# Patient Record
Sex: Female | Born: 2000 | Race: Black or African American | Hispanic: No | Marital: Single | Smoking: Never smoker
Health system: Southern US, Community
[De-identification: ages and names within clinical notes are randomized; demographics above are authoritative.]

---
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
## 2021-06-09 ENCOUNTER — Other Ambulatory Visit: Payer: Self-pay

## 2021-06-09 ENCOUNTER — Emergency Department
Admission: EM | Admit: 2021-06-09 | Discharge: 2021-06-09 | Disposition: A | Discharge status: Home / Self Care | Payer: Medicaid Other | Attending: Emergency Medicine | Admitting: Emergency Medicine

## 2021-06-09 ENCOUNTER — Emergency Department: Payer: Medicaid Other

## 2021-06-09 DIAGNOSIS — T7840XA Allergy, unspecified, initial encounter: Secondary | ICD-10-CM | POA: Insufficient documentation

## 2021-06-09 DIAGNOSIS — R1310 Dysphagia, unspecified: Secondary | ICD-10-CM

## 2021-06-09 MED ORDER — METHYLPREDNISOLONE SODIUM SUCC 125 MG IJ SOLR
125.0000 mg | Freq: Once | INTRAMUSCULAR | Status: AC
  Administered 2021-06-09: 125 mg via INTRAMUSCULAR
  Filled 2021-06-09: qty 2

## 2021-06-09 NOTE — Discharge Instructions (Addendum)
No acute findings on soft tissue neck x-ray.  Monitor complaint and follow-up with ENT clinic if no improvement in 3 to 5 days.  Return right ED if condition worsens.

## 2021-06-09 NOTE — ED Notes (Signed)
See triage note  Presents with p[ossible allergic rxn  States she had hives then hives went away  Bit states she noticed that she had some swelling around her eyes

## 2021-06-09 NOTE — ED Triage Notes (Signed)
Pt to ED POV for possible allergic rxn x2 days, states gets rash when uses the wrong detergent. C/o throat pain No hives noted. No swelling to face, mouth or tongue. RR even and unlabored.  Ambulatory

## 2021-06-09 NOTE — ED Provider Notes (Signed)
[facility] Emergency Department Provider Note   ____________________________________________   Event Date/Time   First MD Initiated Contact with Patient 06/09/21 1220     (approximate)  I have reviewed the triage vital signs and the nursing notes.   HISTORY  Chief Complaint Allergic Reaction    HPI Kristy Larson is a 20 y.o. female patient stated positive allergic reaction for 2 days.  Patient state last night she had hives and awakened this morning with dysphagia.  Denies new foods or drinks.  Patient states she normally has a body reaction to the laundry products but has not purchased any new detergent in the past month.  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  Patient denies angioedema.  Patient rashes dissipated.     History reviewed. No pertinent past medical history.  There are no problems to display for this patient.   History reviewed. No pertinent surgical history.  Prior to Admission medications   Not on File    Allergies Patient has no allergy information on record.  No family history on file.  Social History    Review of Systems  Constitutional: No fever/chills Eyes: No visual changes. ENT: No sore throat.  Mild to moderate distress with swallowing. Cardiovascular: Denies chest pain. Respiratory: Denies shortness of breath. Gastrointestinal: No abdominal pain.  No nausea, no vomiting.  No diarrhea.  No constipation. Genitourinary: Negative for dysuria. Musculoskeletal: Negative for back pain. Skin: Negative for rash. Neurological: Negative for headaches, focal weakness or numbness.   ____________________________________________   PHYSICAL EXAM:  VITAL SIGNS: ED Triage Vitals  Enc Vitals Group     BP 06/09/21 1133 (!) 126/91     Pulse Rate 06/09/21 1133 77     Resp 06/09/21 1133 20     Temp 06/09/21 1133 98.6 F (37 C)     Temp Source 06/09/21 1133 Oral     SpO2  06/09/21 1133 98 %     Weight 06/09/21 1134 (!) 315 lb (142.9 kg)     Height 06/09/21 1134 5\' 6"  (1.676 m)     Head Circumference --      Peak Flow --      Pain Score 06/09/21 1133 4     Pain Loc --      Pain Edu? --      Excl. in GC? --     Constitutional: Alert and oriented. Well appearing and in no acute distress.  BMI is 50.84. Eyes: Conjunctivae are normal. PERRL. EOMI. Head: Atraumatic. Nose: No congestion/rhinnorhea. Mouth/Throat: Mucous membranes are moist.  Oropharynx non-erythematous. Neck: No stridor.   Cardiovascular: Normal rate, regular rhythm. Grossly normal heart sounds.  Good peripheral circulation. Respiratory: Normal respiratory effort.  No retractions. Lungs CTAB. Gastrointestinal: Soft and nontender.  Distention secondary to body habitus. No abdominal bruits. No CVA tenderness. Genitourinary: Deferred Musculoskeletal: No lower extremity tenderness nor edema.  No joint effusions. Neurologic:  Normal speech and language. No gross focal neurologic deficits are appreciated. No gait instability. Skin:  Skin is warm, dry and intact. No rash noted. Psychiatric: Mood and affect are normal. Speech and behavior are normal.  ____________________________________________   LABS (all labs ordered are listed, but only abnormal results are displayed)  Labs Reviewed - No data to display ____________________________________________  EKG   ____________________________________________  RADIOLOGY I, 06/11/21, personally viewed and evaluated these images (plain radiographs) as part of my medical decision making, as well as reviewing the written report by  the radiologist.  ED MD interpretation:    Official radiology report(s): DG Neck Soft Tissue  Result Date: 06/09/2021 CLINICAL DATA:  Dysphagia EXAM: NECK SOFT TISSUES - 1+ VIEW COMPARISON:  None. FINDINGS: There is no evidence of retropharyngeal soft tissue swelling or epiglottic enlargement. The cervical airway  is unremarkable and no radio-opaque foreign body identified. Mild adenoid hypertrophy. Small cervical ribs bilaterally. IMPRESSION: Normal airway. Electronically Signed   By: Marlan Palau M.D.   On: 06/09/2021 12:50    ____________________________________________   PROCEDURES  Procedure(s) performed (including Critical Care):  Procedures   ____________________________________________   INITIAL IMPRESSION / ASSESSMENT AND PLAN / ED COURSE  As part of my medical decision making, I reviewed the following data within the electronic MEDICAL RECORD NUMBER         Patient presents with difficulty swallowing after experiencing a rash last night.  Patient rash has resolved.  Discussed no acute findings on soft tissue neck x-ray.  Patient complaining physical exam consistent with mild dysphagia of unspecified etiology.  Patient advised to continue to monitor condition if no improvement follow-up with ENT clinic in 3 to 5 days.  Return to ED if condition worsens.      ____________________________________________   FINAL CLINICAL IMPRESSION(S) / ED DIAGNOSES  Final diagnoses:  Allergic reaction, initial encounter  Dysphagia, unspecified type     ED Discharge Orders     None        Note:  This document was prepared using Dragon voice recognition software and may include unintentional dictation errors.    Joni Reining, PA-C 06/09/21 1307    Chesley Noon, MD 06/09/21 [phone]

## 2021-07-22 ENCOUNTER — Encounter: Payer: Self-pay | Admitting: Emergency Medicine

## 2021-07-22 ENCOUNTER — Other Ambulatory Visit: Payer: Self-pay

## 2021-07-22 ENCOUNTER — Emergency Department
Admission: EM | Admit: 2021-07-22 | Discharge: 2021-07-22 | Disposition: A | Discharge status: Home / Self Care | Payer: Medicaid Other | Attending: Emergency Medicine | Admitting: Emergency Medicine

## 2021-07-22 ENCOUNTER — Emergency Department: Payer: Medicaid Other

## 2021-07-22 DIAGNOSIS — S63502A Unspecified sprain of left wrist, initial encounter: Secondary | ICD-10-CM | POA: Diagnosis not present

## 2021-07-22 DIAGNOSIS — W1789XA Other fall from one level to another, initial encounter: Secondary | ICD-10-CM | POA: Insufficient documentation

## 2021-07-22 DIAGNOSIS — S66912A Strain of unspecified muscle, fascia and tendon at wrist and hand level, left hand, initial encounter: Secondary | ICD-10-CM | POA: Diagnosis not present

## 2021-07-22 DIAGNOSIS — S6992XA Unspecified injury of left wrist, hand and finger(s), initial encounter: Secondary | ICD-10-CM | POA: Diagnosis present

## 2021-07-22 DIAGNOSIS — M25532 Pain in left wrist: Secondary | ICD-10-CM | POA: Insufficient documentation

## 2021-07-22 MED ORDER — MELOXICAM 15 MG PO TABS: 15.0000 mg | ORAL_TABLET | Freq: Every day | ORAL | 2 refills | Status: AC

## 2021-07-22 MED ORDER — IBUPROFEN 600 MG PO TABS
600.0000 mg | ORAL_TABLET | Freq: Once | ORAL | Status: AC
  Administered 2021-07-22: 600 mg via ORAL
  Filled 2021-07-22: qty 1

## 2021-07-22 NOTE — ED Triage Notes (Signed)
Says she fell about a week ago and injured left wrist.  It has been hurting, but when she woke this am it was much worse.

## 2021-07-22 NOTE — ED Provider Notes (Signed)
[facility] Emergency Department Provider Note  ____________________________________________   Event Date/Time   First MD Initiated Contact with Patient 07/22/21 [phone]     (approximate)  I have reviewed the triage vital signs and the nursing notes.   HISTORY  Chief Complaint Wrist Pain    HPI Kristy Larson is a 20 y.o. female presents emergency department complaining of left wrist pain.  States she fell 1 to 2 weeks ago and landed on her wrist.  States it was doing okay but overnight it became much worse.  States it hurts to bend the wrist or make a fist.  No numbness or tingling.  No elbow pain.  No fever or chills.  Patient has not taken anything for her pain.  History reviewed. No pertinent past medical history.  There are no problems to display for this patient.   History reviewed. No pertinent surgical history.  Prior to Admission medications   Medication Sig Start Date End Date Taking? Authorizing Provider  meloxicam (MOBIC) 15 MG tablet Take 1 tablet (15 mg total) by mouth daily. 07/22/21 07/22/22 Yes Jenea Dake, Roselyn Bering, PA-C    Allergies Pineapple extract  No family history on file.  Social History Social History   Tobacco Use   Smoking status: Never   Smokeless tobacco: Never  Substance Use Topics   Alcohol use: Not Currently    Review of Systems  Constitutional: No fever/chills Eyes: No visual changes. ENT: No sore throat. Respiratory: Denies cough Genitourinary: Negative for dysuria. Musculoskeletal: Negative for back pain.  Positive left wrist pain Skin: Negative for rash. Psychiatric: no mood changes,     ____________________________________________   PHYSICAL EXAM:  VITAL SIGNS: ED Triage Vitals  Enc Vitals Group     BP 07/22/21 0734 (!) 146/99     Pulse Rate 07/22/21 0734 89     Resp 07/22/21 0734 16     Temp 07/22/21 0734 98.5 F (36.9 C)     Temp Source 07/22/21 0734 Oral     SpO2 07/22/21 0734 100 %      Weight 07/22/21 0735 (!) 315 lb (142.9 kg)     Height 07/22/21 0735 5\' 6"  (1.676 m)     Head Circumference --      Peak Flow --      Pain Score 07/22/21 0734 10     Pain Loc --      Pain Edu? --      Excl. in GC? --     Constitutional: Alert and oriented. Well appearing and in no acute distress. Eyes: Conjunctivae are normal.  Head: Atraumatic. Nose: No congestion/rhinnorhea. Mouth/Throat: Mucous membranes are moist.   Neck:  supple no lymphadenopathy noted Cardiovascular: Normal rate, regular rhythm.  Respiratory: Normal respiratory effort.  No retractions GU: deferred Musculoskeletal: FROM all extremities, warm and well perfused, left hand is tender, left wrist is tender, neurovascular is intact, left elbow is nontender Neurologic:  Normal speech and language.  Skin:  Skin is warm, dry and intact. No rash noted. Psychiatric: Mood and affect are normal. Speech and behavior are normal.  ____________________________________________   LABS (all labs ordered are listed, but only abnormal results are displayed)  Labs Reviewed - No data to display ____________________________________________   ____________________________________________  RADIOLOGY  X-ray of the left wrist  ____________________________________________   PROCEDURES  Procedure(s) performed: Left wrist brace applied by nursing staff   Procedures    ____________________________________________   INITIAL IMPRESSION / ASSESSMENT AND PLAN / ED COURSE  Pertinent labs & imaging results that were available during my care of the patient were reviewed by me and considered in my medical decision making (see chart for details).   The patient is a 20 year old female presents emergency department with left wrist pain.  See HPI.  Physical exam shows patient to appear well.  X-ray of the left wrist reviewed by me confirmed by radiology to be negative.  Patient was placed on a wrist brace for comfort.  She was  given a prescription for meloxicam.  She is to follow-up with her regular doctor if not improving in 2 to 3 days.  Return emergency department worsening.  [facility]-up with [facility] if not better in 1 week.  She is to apply ice.  She was discharged stable condition.     Kristy Larson was evaluated in Emergency Department on 07/22/2021 for the symptoms described in the history of present illness. She was evaluated in the context of the global COVID-19 pandemic, which necessitated consideration that the patient might be at risk for infection with the SARS-CoV-2 virus that causes COVID-19. Institutional protocols and algorithms that pertain to the evaluation of patients at risk for COVID-19 are in a state of rapid change based on information released by regulatory bodies including the CDC and federal and state organizations. These policies and algorithms were followed during the patient's care in the ED.    As part of my medical decision making, I reviewed the following data within the electronic MEDICAL RECORD NUMBER Nursing notes reviewed and incorporated, Old chart reviewed, Radiograph reviewed , Notes from prior ED visits, and [place] Controlled Substance Database  ____________________________________________   FINAL CLINICAL IMPRESSION(S) / ED DIAGNOSES  Final diagnoses:  Sprain and strain of left wrist      NEW MEDICATIONS STARTED DURING THIS VISIT:  New Prescriptions   MELOXICAM (MOBIC) 15 MG TABLET    Take 1 tablet (15 mg total) by mouth daily.     Note:  This document was prepared using Dragon voice recognition software and may include unintentional dictation errors.    Faythe Ghee, PA-C 07/22/21 1000    Arnaldo Natal, MD 07/22/21 [phone]

## 2021-11-25 ENCOUNTER — Other Ambulatory Visit: Payer: Self-pay

## 2021-11-25 DIAGNOSIS — T7840XA Allergy, unspecified, initial encounter: Secondary | ICD-10-CM | POA: Insufficient documentation

## 2021-11-25 DIAGNOSIS — L299 Pruritus, unspecified: Secondary | ICD-10-CM | POA: Diagnosis not present

## 2021-11-25 NOTE — ED Triage Notes (Signed)
Pt states she has been taking benadryl for the last two days. Pt states she also has had a irritated throat for the past two days, Hives all over her body started today. Eyelid was swollen earlier. Denies any difficulty breathing. Took benadryl yesterday and did not take medication for it today.  Pt states this happens every time she comes home from school. Is not sure if it is her mothers house or the detergent she uses.

## 2021-11-26 ENCOUNTER — Emergency Department
Admission: EM | Admit: 2021-11-26 | Discharge: 2021-11-26 | Disposition: A | Discharge status: Home / Self Care | Payer: Medicaid Other | Attending: Emergency Medicine | Admitting: Emergency Medicine

## 2021-11-26 DIAGNOSIS — T7840XA Allergy, unspecified, initial encounter: Secondary | ICD-10-CM

## 2021-11-26 MED ORDER — PREDNISONE 20 MG PO TABS: 40.0000 mg | ORAL_TABLET | Freq: Every day | ORAL | 0 refills | Status: AC

## 2021-11-26 MED ORDER — EPINEPHRINE 0.3 MG/0.3ML IJ SOAJ: 0.3000 mg | INTRAMUSCULAR | 0 refills | Status: DC | PRN

## 2021-11-26 NOTE — Discharge Instructions (Addendum)
Please seek medical attention for any high fevers, chest pain, shortness of breath, change in behavior, persistent vomiting, bloody stool or any other new or concerning symptoms.  

## 2021-11-26 NOTE — ED Provider Notes (Signed)
[facility] Emergency Department Provider Note   ____________________________________________   I have reviewed the triage vital signs and the nursing notes.   HISTORY  Chief Complaint Allergies   History limited by: Not Limited   HPI Kristy Larson is a 20 y.o. female who presents to the emergency department today because of concerns for allergic reaction.  The patient states for the past 2 to 3 days she has noticed hives.  This has been itchy.  She comes in tonight because she started feeling some itchiness in her throat.  She has had similar symptoms in the past although has never had the throat involvement.  Patient has not seen an allergy specialist.  She has been trying Benadryl over the past couple of days without any significant relief.  She denies any vomiting.  No lightheadedness or syncope.  Records reviewed. Per medical record review patient has a history of ER visit roughly 6 months ago for allergic reaction.   There are no problems to display for this patient.   No past surgical history on file.  Prior to Admission medications   Medication Sig Start Date End Date Taking? Authorizing Provider  EPINEPHrine (EPIPEN 2-PAK) 0.3 mg/0.3 mL IJ SOAJ injection Inject 0.3 mg into the muscle as needed for anaphylaxis. 11/26/21  Yes Phineas Semen, MD  predniSONE (DELTASONE) 20 MG tablet Take 2 tablets (40 mg total) by mouth daily with breakfast for 4 days. 11/26/21 11/30/21 Yes Phineas Semen, MD  meloxicam (MOBIC) 15 MG tablet Take 1 tablet (15 mg total) by mouth daily. 07/22/21 07/22/22  Sherrie Mustache Roselyn Bering, PA-C    Allergies Pineapple extract  No family history on file.  Social History Social History   Tobacco Use   Smoking status: Never   Smokeless tobacco: Never  Substance Use Topics   Alcohol use: Not Currently    Review of Systems Constitutional: No fever/chills Eyes: No visual changes. ENT: Positive for itchy throat.  Cardiovascular:  Denies chest pain. Respiratory: Denies shortness of breath. Gastrointestinal: No abdominal pain.  No nausea, no vomiting.  No diarrhea.   Genitourinary: Negative for dysuria. Musculoskeletal: Negative for back pain. Skin: Positive for rash and pruritis.  Neurological: Negative for headaches, focal weakness or numbness.  ____________________________________________   PHYSICAL EXAM:  VITAL SIGNS: ED Triage Vitals  Enc Vitals Group     BP 11/25/21 2228 117/77     Pulse Rate 11/25/21 2228 (!) 105     Resp 11/25/21 2228 18     Temp 11/25/21 2228 98.6 F (37 C)     Temp Source 11/25/21 2228 Oral     SpO2 11/25/21 2228 97 %     Weight 11/25/21 2231 (!) 315 lb 0.6 oz (142.9 kg)     Height --      Head Circumference --      Peak Flow --      Pain Score 11/25/21 2231 0   Constitutional: Alert and oriented.  Eyes: Conjunctivae are normal.  ENT      Head: Normocephalic and atraumatic.      Nose: No congestion/rhinnorhea.      Mouth/Throat: Mucous membranes are moist.      Neck: No stridor. Hematological/Lymphatic/Immunilogical: No cervical lymphadenopathy. Cardiovascular: Normal rate, regular rhythm.  No murmurs, rubs, or gallops.  Respiratory: Normal respiratory effort without tachypnea nor retractions. Breath sounds are clear and equal bilaterally. No wheezes/rales/rhonchi. Gastrointestinal: Soft and non tender. No rebound. No guarding.  Genitourinary: Deferred Musculoskeletal: Normal range of motion in all  extremities. No lower extremity edema. Neurologic:  Normal speech and language. No gross focal neurologic deficits are appreciated.  Skin:  Hives Psychiatric: Mood and affect are normal. Speech and behavior are normal. Patient exhibits appropriate insight and judgment.  ____________________________________________    LABS (pertinent positives/negatives)  None  ____________________________________________   EKG  None  ____________________________________________     RADIOLOGY  None  ____________________________________________   PROCEDURES  Procedures  ____________________________________________   INITIAL IMPRESSION / ASSESSMENT AND PLAN / ED COURSE  Pertinent labs & imaging results that were available during my care of the patient were reviewed by me and considered in my medical decision making (see chart for details).   Patient presents to the emergency department today because of concerns for an allergic reaction.  Unclear allergen at this point.  Patient does have hives.  No shortness of breath at this time.  Will plan on starting patient on prednisone.  Encourage patient to continue Benadryl.  Will also give patient prescription for EpiPen in case patient develops throat closing or shortness of breath.  ____________________________________________   FINAL CLINICAL IMPRESSION(S) / ED DIAGNOSES  Final diagnoses:  Allergic reaction, initial encounter     Note: This dictation was prepared with Dragon dictation. Any transcriptional errors that result from this process are unintentional     Phineas Semen, MD 11/26/21 [phone]

## 2023-01-02 IMAGING — CR DG WRIST COMPLETE 3+V*L*
4 series · 4 of 4 positions shown · non-contrast
Comparison: None.

CLINICAL DATA: 19-year-old female status post fall 1-2 weeks ago
with continued pain which is more severe this morning.

EXAM:
LEFT WRIST - COMPLETE 3+ VIEW

[wrist pa]
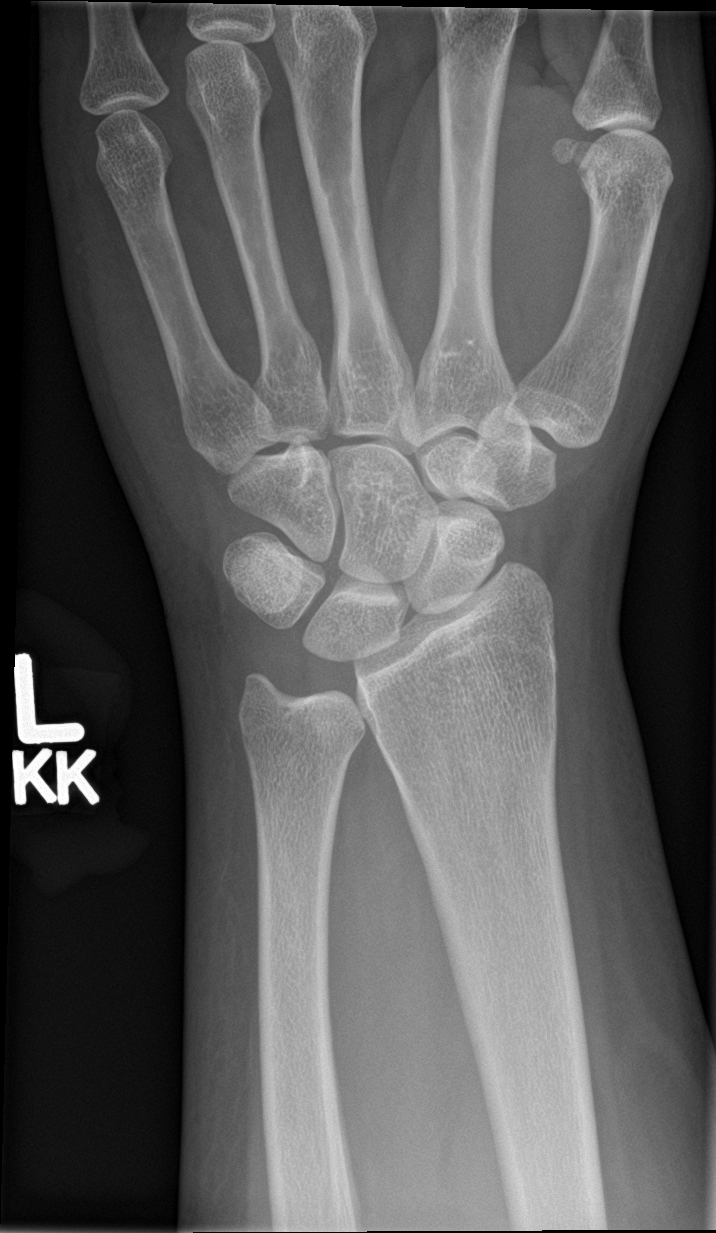

[wrist obl]
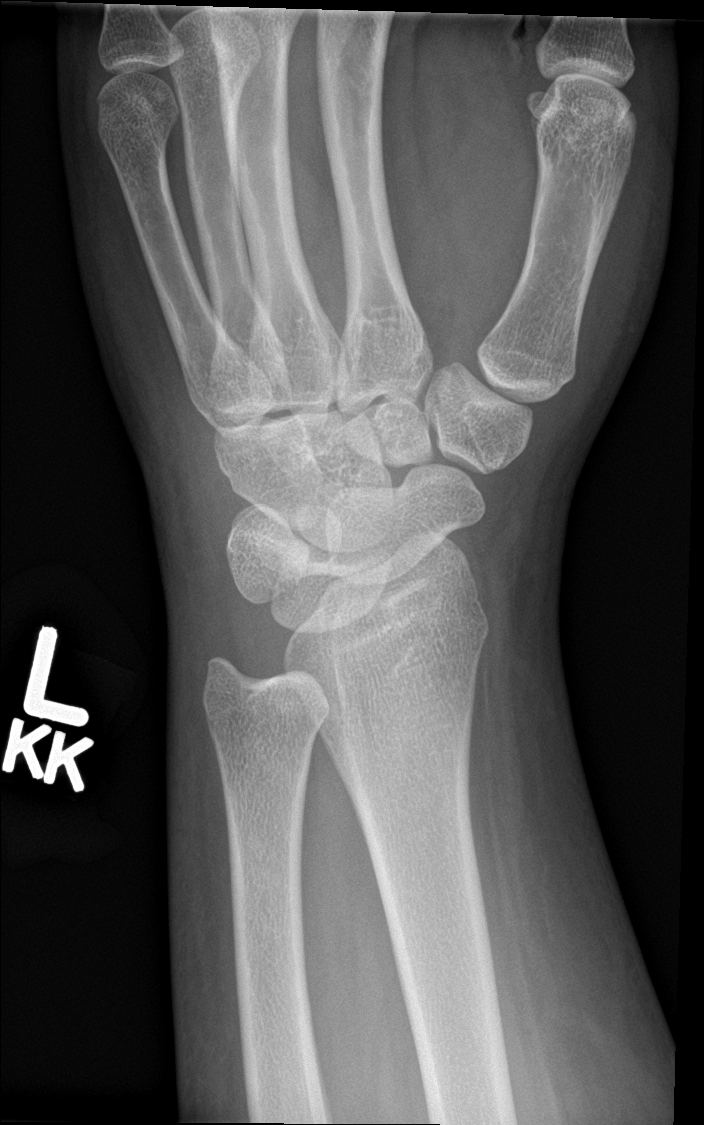

[wrist lat]
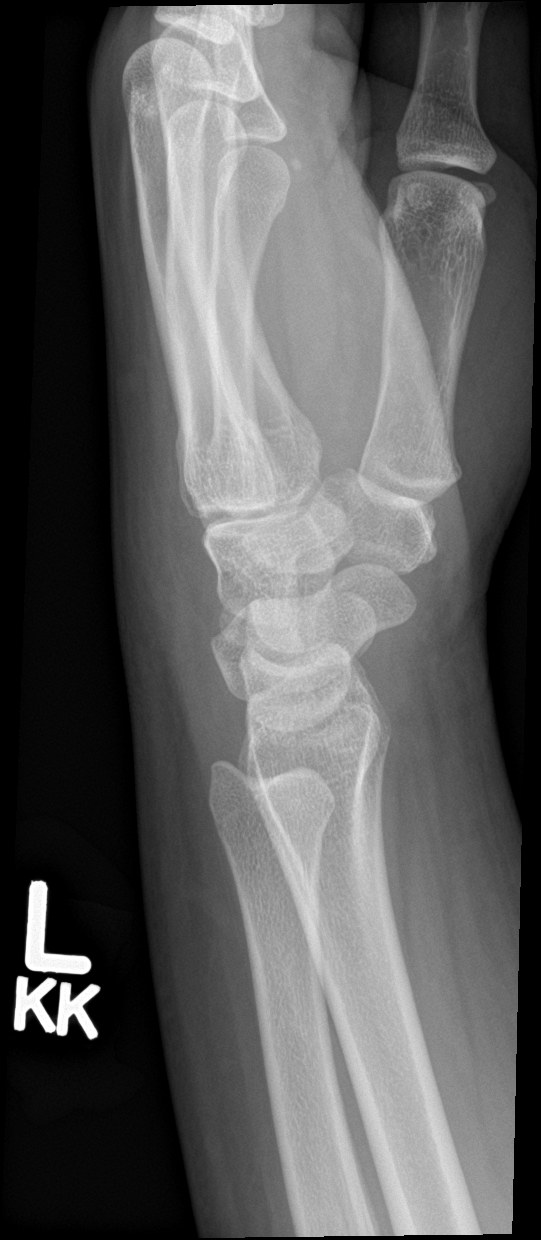

[navicular]
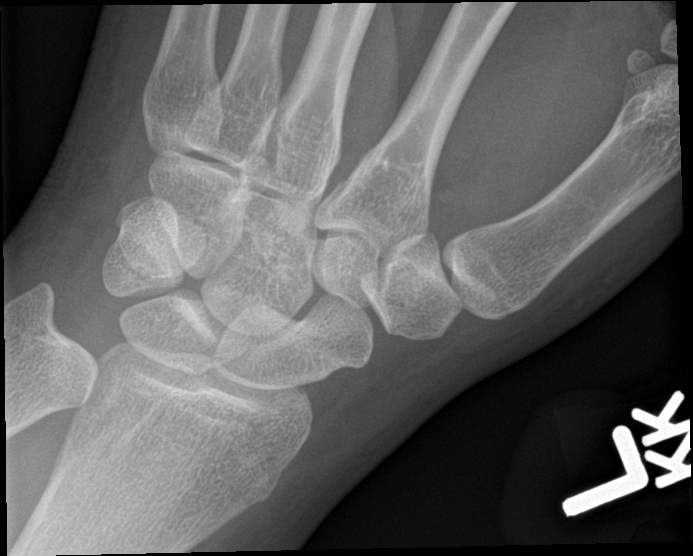

[4 of 4 positions shown; findings below may reference images not displayed]

FINDINGS: Bone mineralization is within normal limits. Distal radius and ulna
appear intact. Carpal bones appear intact with normal joint spaces
and alignment. Visible metacarpals appear intact. No osseous
abnormality identified. mild generalized soft tissue swelling at the
wrist.
IMPRESSION: Generalized soft tissue swelling at the left wrist. No osseous
abnormality identified.

## 2024-01-15 ENCOUNTER — Ambulatory Visit: Payer: Medicaid Other | Admitting: Family Medicine

## 2024-01-17 ENCOUNTER — Telehealth: Payer: Self-pay

## 2024-01-17 NOTE — Telephone Encounter (Signed)
LVM--We are sorry that you missed your appointment with Kristy Larson  on 01/15/2024 . Your health and follow-up medical care are important to [place]. Please call or reschedule appointment on Mychart.  If you have already rescheduled your appointment, please disregard this. Ok for [facility] to reschedule.

## 2024-06-06 ENCOUNTER — Other Ambulatory Visit

## 2024-06-09 ENCOUNTER — Other Ambulatory Visit

## 2024-07-11 ENCOUNTER — Ambulatory Visit: Admitting: Physician Assistant

## 2024-09-30 ENCOUNTER — Ambulatory Visit: Admitting: Family Medicine

## 2024-09-30 ENCOUNTER — Encounter: Payer: Self-pay | Admitting: Family Medicine

## 2024-09-30 VITALS — BP 113/74 | HR 91 | Temp 98.7°F | Resp 20 | Ht 66.0 in | Wt 378.0 lb

## 2024-09-30 DIAGNOSIS — T782XXD Anaphylactic shock, unspecified, subsequent encounter: Secondary | ICD-10-CM | POA: Diagnosis not present

## 2024-09-30 DIAGNOSIS — E039 Hypothyroidism, unspecified: Secondary | ICD-10-CM | POA: Insufficient documentation

## 2024-09-30 DIAGNOSIS — T782XXA Anaphylactic shock, unspecified, initial encounter: Secondary | ICD-10-CM | POA: Insufficient documentation

## 2024-09-30 DIAGNOSIS — Z23 Encounter for immunization: Secondary | ICD-10-CM

## 2024-09-30 DIAGNOSIS — N920 Excessive and frequent menstruation with regular cycle: Secondary | ICD-10-CM

## 2024-09-30 MED ORDER — EPINEPHRINE 0.3 MG/0.3ML IJ SOAJ: 0.3000 mg | INTRAMUSCULAR | 0 refills | Status: AC | PRN

## 2024-09-30 NOTE — Assessment & Plan Note (Signed)
 Checking her CBC and iron indices.

## 2024-09-30 NOTE — Progress Notes (Signed)
 New Patient Office Visit  Subjective    Patient ID: Kristy Larson, female    DOB: 2001/07/18  Age: 23 y.o. 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  Discussed the use of AI scribe software for clinical note transcription with the patient, who gave verbal consent to proceed.  History of Present Illness   Kristy Larson is a 23 year old female who presents for re-establishment of care after her previous doctor's office closed.  She has a history of thyroid issues and was previously prescribed levothyroxine, which she has not taken for over a year due to her previous doctor's office closing. Her last blood test indicated elevated thyroid levels, which were in the double digits when they should have been in the single digits. She has not had any recent follow-up or medication management for her thyroid condition.  She graduated from [employer] with a degree in education and is currently working as a Physicist, medical at [employer] She lives with her mother and primarily handles the cooking at home.  Her menstrual cycles are regular and usually light, except for the second day, which is the heaviest. She does not use contraception. Anemia runs in her family.  She has no known medication allergies but carries an EpiPen  for a pineapple allergy. She has not had any surgeries.       Outpatient Encounter Medications as of 09/30/2024  Medication Sig   [DISCONTINUED] EPINEPHrine  (EPIPEN  2-PAK) 0.3 mg/0.3 mL IJ SOAJ injection Inject 0.3 mg into the muscle as needed for anaphylaxis.   EPINEPHrine  (EPIPEN  2-PAK) 0.3 mg/0.3 mL IJ SOAJ injection Inject 0.3 mg into the muscle as needed for anaphylaxis.   No facility-administered encounter medications on file as of 09/30/2024.    History reviewed. No  pertinent past medical history.  History reviewed. No pertinent surgical history.  History reviewed. No pertinent family history.  Social History   Socioeconomic History   Marital status: Single    Spouse name: Not on file   Number of children: Not on file   Years of education: Not on file   Highest education level: Not on file  Occupational History   Not on file  Tobacco Use   Smoking status: Never    Passive exposure: Current   Smokeless tobacco: Never  Substance and Sexual Activity   Alcohol use: Not Currently   Drug use: Never   Sexual activity: Not on file  Other Topics Concern   Not on file  Social History Narrative   Not on file   Social Drivers of Health   Financial Resource Strain: Not on file  Food Insecurity: Not on file  Transportation Needs: Not on file  Physical Activity: Not on file  Stress: Not on file  Social Connections: Not on file  Intimate Partner Violence: Not on file    ROS      Objective   BP 113/74 (BP Location: Left Arm, Patient Position: Sitting, Cuff Size: Large)   Pulse 91   Temp 98.7 F (37.1 C) (Oral)   Resp 20   Ht 5' 6 (1.676 m)   Wt (!) 378 lb (171.5 kg)   LMP 09/19/2024 (Exact Date)   SpO2 97%   BMI 61.01 kg/m    Physical Exam Vitals and nursing note reviewed.  Constitutional:  Appearance: Normal appearance.  HENT:     Head: Normocephalic and atraumatic.  Eyes:     Conjunctiva/sclera: Conjunctivae normal.  Cardiovascular:     Rate and Rhythm: Normal rate and regular rhythm.  Pulmonary:     Effort: Pulmonary effort is normal.     Breath sounds: Normal breath sounds.  Musculoskeletal:     Right lower leg: No edema.     Left lower leg: No edema.  Skin:    General: Skin is warm and dry.  Neurological:     Mental Status: She is alert and oriented to person, place, and time.  Psychiatric:        Mood and Affect: Mood normal.        Behavior: Behavior normal.        Thought Content: Thought content  normal.        Judgment: Judgment normal.            The ASCVD Risk score (Arnett DK, et al., 2019) failed to calculate for the following reasons:   The 2019 ASCVD risk score is only valid for ages 80 to 41     Assessment & Plan:  Immunization due -     Flu vaccine trivalent PF, 6mos and older(Flulaval,Afluria,Fluarix,Fluzone)  Menorrhagia with regular cycle Assessment & Plan: Checking her CBC and iron indices.  Orders: -     CBC with Differential/Platelet -     Iron, TIBC and Ferritin Panel  Hypothyroidism (acquired) Assessment & Plan: Checking her TSH and free T4.  She reports she has not had any levothyroxine in greater than a year.  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  Carries an epipen  for this.    Orders: -     EPINEPHrine ; Inject 0.3 mg into the muscle as needed for anaphylaxis.  Dispense: 1 each; Refill: 0    Return if symptoms worsen or fail to improve.   Glover Capano K Jerrik Housholder, MD

## 2024-09-30 NOTE — Assessment & Plan Note (Signed)
 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  Carries an epipen  for this.

## 2024-09-30 NOTE — Assessment & Plan Note (Signed)
 Checking her TSH and free T4.  She reports she has not had any levothyroxine in greater than a year.

## 2024-10-01 ENCOUNTER — Telehealth: Payer: Self-pay | Admitting: Family Medicine

## 2024-10-01 LAB — CBC WITH DIFFERENTIAL/PLATELET
Basophils Absolute: 0.1 x10E3/uL (ref 0.0–0.2)
Basos: 1 %
EOS (ABSOLUTE): 0.5 x10E3/uL — ABNORMAL HIGH (ref 0.0–0.4)
Eos: 5 %
Hematocrit: 31.8 % — ABNORMAL LOW (ref 34.0–46.6)
Hemoglobin: 9.5 g/dL — ABNORMAL LOW (ref 11.1–15.9)
Immature Grans (Abs): 0 x10E3/uL (ref 0.0–0.1)
Immature Granulocytes: 0 %
Lymphocytes Absolute: 1.7 x10E3/uL (ref 0.7–3.1)
Lymphs: 19 %
MCH: 23.5 pg — ABNORMAL LOW (ref 26.6–33.0)
MCHC: 29.9 g/dL — ABNORMAL LOW (ref 31.5–35.7)
MCV: 79 fL (ref 79–97)
Monocytes Absolute: 0.7 x10E3/uL (ref 0.1–0.9)
Monocytes: 8 %
Neutrophils Absolute: 6 x10E3/uL (ref 1.4–7.0)
Neutrophils: 67 %
Platelets: 342 x10E3/uL (ref 150–450)
RBC: 4.05 x10E6/uL (ref 3.77–5.28)
RDW: 14.7 % (ref 11.7–15.4)
WBC: 8.9 x10E3/uL (ref 3.4–10.8)

## 2024-10-01 LAB — IRON,TIBC AND FERRITIN PANEL
Ferritin: 7 ng/mL — ABNORMAL LOW (ref 15–150)
Iron Saturation: 5 % — CL (ref 15–55)
Iron: 18 ug/dL — ABNORMAL LOW (ref 27–159)
Total Iron Binding Capacity: 398 ug/dL (ref 250–450)
UIBC: 380 ug/dL (ref 131–425)

## 2024-10-01 LAB — TSH+FREE T4
Free T4: 0.87 ng/dL (ref 0.82–1.77)
TSH: 18.6 u[IU]/mL — ABNORMAL HIGH (ref 0.450–4.500)

## 2024-10-01 NOTE — Telephone Encounter (Signed)
 Called patient and advised that she is anemic with a hemoglobin of 9.5 and that her iron indices are very low with an iron saturation of 5 and a ferritin of 7.  As far she knows she can take iron by mouth.  Ask her to pick up iron sulfate 365 mg and vitamin C 500 mg and take 1 of each daily for a month.  Will call with an appointment in a month for blood work.

## 2024-10-20 ENCOUNTER — Ambulatory Visit: Admitting: Family Medicine

## 2024-10-20 ENCOUNTER — Encounter: Payer: Self-pay | Admitting: Family Medicine

## 2024-10-20 VITALS — BP 121/83 | HR 92 | Temp 98.5°F | Resp 18 | Ht 66.0 in | Wt 377.0 lb

## 2024-10-20 DIAGNOSIS — Z111 Encounter for screening for respiratory tuberculosis: Secondary | ICD-10-CM

## 2024-10-20 DIAGNOSIS — Z113 Encounter for screening for infections with a predominantly sexual mode of transmission: Secondary | ICD-10-CM

## 2024-10-20 DIAGNOSIS — Z202 Contact with and (suspected) exposure to infections with a predominantly sexual mode of transmission: Secondary | ICD-10-CM | POA: Diagnosis not present

## 2024-10-20 NOTE — Progress Notes (Signed)
   Established Patient Office Visit  Subjective   Patient ID: Kristy Larson, female    DOB: 2001-06-29  Age: 23 y.o. 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  She wants to be screened for STIs.  She has a cold sore and request treatment for that.  She needs a TB test for her job.  And a form completed.  She wears glasses and reports she has no back problems.            ROS    Objective:     BP 121/83 (BP Location: Left Arm, Patient Position: Sitting, Cuff Size: Large)   Pulse 92   Temp 98.5 F (36.9 C) (Oral)   Resp 18   Ht 5' 6 (1.676 m)   Wt (!) 377 lb (171 kg)   LMP 09/19/2024 (Exact Date)   SpO2 96%   BMI 60.85 kg/m    Physical Exam Vitals reviewed.  Constitutional:      Appearance: Normal appearance.  HENT:     Head: Normocephalic.  Eyes:     General:        Right eye: No discharge.        Left eye: No discharge.  Cardiovascular:     Rate and Rhythm: Normal rate.  Pulmonary:     Effort: Pulmonary effort is normal.  Neurological:     Mental Status: She is alert and oriented to person, place, and time.  Psychiatric:        Mood and Affect: Mood normal.        Behavior: Behavior normal.        Thought Content: Thought content normal.        Judgment: Judgment normal.          Results for orders placed or performed in visit on 10/20/24  NuSwab Vaginitis Plus (VG+)  Result Value Ref Range   Atopobium vaginae High - 2 (A) Score   BVAB 2 Moderate - 1 Score   Megasphaera 1 High - 2 (A) Score   Candida albicans, NAA Negative Negative   Candida glabrata, NAA Negative Negative   Trich vag by NAA Negative Negative   Chlamydia trachomatis, NAA Negative Negative   Neisseria gonorrhoeae, NAA Negative Negative  PPD  Result Value Ref Range   TB Skin Test Negative    Induration 0.0 mm      The ASCVD Risk score (Arnett DK,  et al., 2019) failed to calculate for the following reasons:   The 2019 ASCVD risk score is only valid for ages 65 to 80    Assessment & Plan:  Possible exposure to STI -     NuSwab Vaginitis Plus (VG+) -     HSV(herpes simplex vrs) 1+2 ab-IgG; Future -     HIV Antibody (routine testing w rflx); Future -     RPR W/RFLX TO RPR TITER, TREPONEMAL AB, SCREEN AND DIAGNOSIS; Future  Screening for tuberculosis -     TB Skin Test  Screening for STDs (sexually transmitted diseases) Assessment & Plan: She is asymptomatic.  Denies vaginal discharge, abdominal pain, dysuria, fever or chills.  Will have patient self collect vaginal sample for STI.      No follow-ups on file.    Shantea Poulton K Ralph Brouwer, MD

## 2024-10-23 ENCOUNTER — Other Ambulatory Visit: Payer: Self-pay

## 2024-10-23 DIAGNOSIS — Z111 Encounter for screening for respiratory tuberculosis: Secondary | ICD-10-CM

## 2024-10-23 LAB — NUSWAB VAGINITIS PLUS (VG+)
Atopobium vaginae: HIGH {score} — AB
Candida albicans, NAA: NEGATIVE
Candida glabrata, NAA: NEGATIVE
Chlamydia trachomatis, NAA: NEGATIVE
Megasphaera 1: HIGH {score} — AB
Neisseria gonorrhoeae, NAA: NEGATIVE
Trich vag by NAA: NEGATIVE

## 2024-10-23 LAB — TB SKIN TEST
Induration: 0 mm
TB Skin Test: NEGATIVE

## 2024-10-30 ENCOUNTER — Ambulatory Visit: Payer: Self-pay | Admitting: Family Medicine

## 2024-10-30 DIAGNOSIS — B9689 Other specified bacterial agents as the cause of diseases classified elsewhere: Secondary | ICD-10-CM

## 2024-10-30 MED ORDER — METRONIDAZOLE 500 MG PO TABS
500.0000 mg | ORAL_TABLET | Freq: Two times a day (BID) | ORAL | 0 refills | Status: AC
Start: 2024-10-30 — End: 2024-11-06

## 2024-10-31 ENCOUNTER — Ambulatory Visit: Admitting: Family Medicine

## 2024-11-23 DIAGNOSIS — Z113 Encounter for screening for infections with a predominantly sexual mode of transmission: Secondary | ICD-10-CM | POA: Insufficient documentation

## 2024-11-23 NOTE — Assessment & Plan Note (Signed)
 She is asymptomatic.  Denies vaginal discharge, abdominal pain, dysuria, fever or chills.  Will have patient self collect vaginal sample for STI.
